# Patient Record
Sex: Female | Born: 1975 | Race: White | Hispanic: No | Smoking: Never smoker
Health system: Southern US, Community
[De-identification: ages and names within clinical notes are randomized; demographics above are authoritative.]

## PROBLEM LIST (undated history)

## (undated) DIAGNOSIS — E15 Nondiabetic hypoglycemic coma: Secondary | ICD-10-CM

## (undated) DIAGNOSIS — I10 Essential (primary) hypertension: Secondary | ICD-10-CM

## (undated) DIAGNOSIS — J45909 Unspecified asthma, uncomplicated: Secondary | ICD-10-CM

## (undated) DIAGNOSIS — D649 Anemia, unspecified: Secondary | ICD-10-CM

## (undated) HISTORY — PX: OTHER SURGICAL HISTORY: SHX169

---

## 2013-01-09 ENCOUNTER — Encounter (HOSPITAL_COMMUNITY): Payer: Self-pay | Admitting: Emergency Medicine

## 2013-01-09 ENCOUNTER — Emergency Department (HOSPITAL_COMMUNITY)
Admission: EM | Admit: 2013-01-09 | Discharge: 2013-01-09 | Disposition: A | Discharge status: Home / Self Care | Payer: Medicaid Other | Attending: Emergency Medicine | Admitting: Emergency Medicine

## 2013-01-09 DIAGNOSIS — I10 Essential (primary) hypertension: Secondary | ICD-10-CM | POA: Insufficient documentation

## 2013-01-09 DIAGNOSIS — J45909 Unspecified asthma, uncomplicated: Secondary | ICD-10-CM | POA: Insufficient documentation

## 2013-01-09 DIAGNOSIS — R3915 Urgency of urination: Secondary | ICD-10-CM | POA: Insufficient documentation

## 2013-01-09 DIAGNOSIS — Z87440 Personal history of urinary (tract) infections: Secondary | ICD-10-CM | POA: Insufficient documentation

## 2013-01-09 DIAGNOSIS — R300 Dysuria: Secondary | ICD-10-CM | POA: Insufficient documentation

## 2013-01-09 DIAGNOSIS — R350 Frequency of micturition: Secondary | ICD-10-CM | POA: Insufficient documentation

## 2013-01-09 DIAGNOSIS — H53149 Visual discomfort, unspecified: Secondary | ICD-10-CM | POA: Insufficient documentation

## 2013-01-09 DIAGNOSIS — R112 Nausea with vomiting, unspecified: Secondary | ICD-10-CM | POA: Insufficient documentation

## 2013-01-09 DIAGNOSIS — Z880 Allergy status to penicillin: Secondary | ICD-10-CM | POA: Insufficient documentation

## 2013-01-09 DIAGNOSIS — G43909 Migraine, unspecified, not intractable, without status migrainosus: Secondary | ICD-10-CM | POA: Insufficient documentation

## 2013-01-09 DIAGNOSIS — R109 Unspecified abdominal pain: Secondary | ICD-10-CM | POA: Insufficient documentation

## 2013-01-09 DIAGNOSIS — D649 Anemia, unspecified: Secondary | ICD-10-CM | POA: Insufficient documentation

## 2013-01-09 DIAGNOSIS — Z79899 Other long term (current) drug therapy: Secondary | ICD-10-CM | POA: Insufficient documentation

## 2013-01-09 HISTORY — DX: Nondiabetic hypoglycemic coma: E15

## 2013-01-09 HISTORY — DX: Essential (primary) hypertension: I10

## 2013-01-09 HISTORY — DX: Anemia, unspecified: D64.9

## 2013-01-09 HISTORY — DX: Unspecified asthma, uncomplicated: J45.909

## 2013-01-09 LAB — URINALYSIS, ROUTINE W REFLEX MICROSCOPIC
Leukocytes, UA: NEGATIVE
Nitrite: NEGATIVE
Specific Gravity, Urine: 1.023 (ref 1.005–1.030)
Urobilinogen, UA: 1 mg/dL (ref 0.0–1.0)
pH: 5.5 (ref 5.0–8.0)

## 2013-01-09 LAB — WET PREP, GENITAL
Clue Cells Wet Prep HPF POC: NONE SEEN
Trich, Wet Prep: NONE SEEN

## 2013-01-09 MED ORDER — PROMETHAZINE HCL 25 MG PO TABS: 25.0000 mg | ORAL_TABLET | Freq: Four times a day (QID) | ORAL | Status: AC | PRN

## 2013-01-09 MED ORDER — DIPHENHYDRAMINE HCL 25 MG PO CAPS
50.0000 mg | ORAL_CAPSULE | Freq: Once | ORAL | Status: DC
  Filled 2013-01-09 (×2): qty 1

## 2013-01-09 MED ORDER — KETOROLAC TROMETHAMINE 30 MG/ML IJ SOLN
30.0000 mg | Freq: Once | INTRAMUSCULAR | Status: AC
  Administered 2013-01-09: 30 mg via INTRAVENOUS
  Filled 2013-01-09: qty 1

## 2013-01-09 MED ORDER — METOCLOPRAMIDE HCL 5 MG/ML IJ SOLN
10.0000 mg | Freq: Once | INTRAMUSCULAR | Status: AC
  Administered 2013-01-09: 10 mg via INTRAVENOUS
  Filled 2013-01-09: qty 2

## 2013-01-09 MED ORDER — ONDANSETRON HCL 4 MG/2ML IJ SOLN
4.0000 mg | Freq: Once | INTRAMUSCULAR | Status: AC
  Administered 2013-01-09: 4 mg via INTRAVENOUS
  Filled 2013-01-09: qty 2

## 2013-01-09 MED ORDER — DIPHENHYDRAMINE HCL 50 MG/ML IJ SOLN
25.0000 mg | Freq: Once | INTRAMUSCULAR | Status: AC
  Administered 2013-01-09: 25 mg via INTRAVENOUS
  Filled 2013-01-09: qty 1

## 2013-01-09 MED ORDER — SODIUM CHLORIDE 0.9 % IV BOLUS (SEPSIS)
1000.0000 mL | Freq: Once | INTRAVENOUS | Status: AC
  Administered 2013-01-09: 1000 mL via INTRAVENOUS

## 2013-01-09 MED ORDER — KETOROLAC TROMETHAMINE 60 MG/2ML IM SOLN
60.0000 mg | Freq: Once | INTRAMUSCULAR | Status: DC
  Filled 2013-01-09: qty 2

## 2013-01-09 MED ORDER — METOCLOPRAMIDE HCL 10 MG PO TABS: 10.0000 mg | ORAL_TABLET | Freq: Once | ORAL | Status: DC

## 2013-01-09 MED ORDER — ONDANSETRON 8 MG PO TBDP
8.0000 mg | ORAL_TABLET | Freq: Once | ORAL | Status: DC
  Filled 2013-01-09: qty 1
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
## 2013-01-09 NOTE — ED Notes (Signed)
Pt states she has bladder pain today causing headache with ora and nausea. States she is having some trouble starting to urinate, frequency x 3 days. Has a history of multiple abdominal surgeries: ureter rerouted and bleeding ulcers, gastric bypass.

## 2013-01-09 NOTE — ED Notes (Signed)
Pt arrived to the ED with a chief complaint of a migraine headache .  Pt states the headache began 2 days ago and it was not until today that the pt felt is was a migraine. Pt also believes she has a UTI due to lower abdominal pain.  No burning upon urination but states she is prone to UTI's and that burning sensation is not a symptom she experiences.

## 2013-01-13 NOTE — ED Provider Notes (Signed)
Medical screening examination/treatment/procedure(s) were conducted as a shared visit with non-physician practitioner(s) and myself.  I personally evaluated the patient during the encounter.  No neuro deficits. Headache improved  Donnetta Hutching, MD 01/13/13 1630

## 2013-05-16 ENCOUNTER — Emergency Department (HOSPITAL_COMMUNITY)
Admission: EM | Admit: 2013-05-16 | Discharge: 2013-05-16 | Disposition: A | Discharge status: Home / Self Care | Payer: Medicaid Other | Source: Home / Self Care | Attending: Family Medicine | Admitting: Family Medicine

## 2013-05-16 ENCOUNTER — Encounter (HOSPITAL_COMMUNITY): Payer: Self-pay | Admitting: Emergency Medicine

## 2013-05-16 DIAGNOSIS — J111 Influenza due to unidentified influenza virus with other respiratory manifestations: Secondary | ICD-10-CM

## 2013-05-16 DIAGNOSIS — A0811 Acute gastroenteropathy due to Norwalk agent: Secondary | ICD-10-CM

## 2013-05-16 MED ORDER — ONDANSETRON 4 MG PO TBDP
ORAL_TABLET | ORAL | Status: AC
  Filled 2013-05-16: qty 2

## 2013-05-16 MED ORDER — ONDANSETRON HCL 4 MG PO TABS: 4.0000 mg | ORAL_TABLET | Freq: Four times a day (QID) | ORAL | Status: AC

## 2013-05-16 MED ORDER — IPRATROPIUM BROMIDE 0.06 % NA SOLN: 2.0000 | Freq: Four times a day (QID) | NASAL | Status: AC

## 2013-05-16 MED ORDER — ONDANSETRON 4 MG PO TBDP
8.0000 mg | ORAL_TABLET | Freq: Once | ORAL | Status: AC
  Administered 2013-05-16: 8 mg via ORAL

## 2013-05-16 NOTE — ED Provider Notes (Signed)
CSN: [id number]     Arrival date & time 05/16/13  1605 History   First MD Initiated Contact with Patient 05/16/13 1738     Chief Complaint  Patient presents with  . Facial Pain  . Influenza   (Consider location/radiation/quality/duration/timing/severity/associated sxs/prior Treatment) Patient is a 37 y.o. female presenting with flu symptoms. The history is provided by the patient and the spouse.  Influenza Presenting symptoms: cough, diarrhea, fever, headache, myalgias, nausea, rhinorrhea, sore throat and vomiting   Severity:  Moderate Duration:  2 weeks Progression:  Waxing and waning Chronicity:  New Associated symptoms: chills, decreased appetite and nasal congestion     Past Medical History  Diagnosis Date  . Asthma   . Anemia   . Hypertension   . Hypoglycemic coma    Past Surgical History  Procedure Laterality Date  . Gallbladder removed     History reviewed. No pertinent family history. History  Substance Use Topics  . Smoking status: Never Smoker   . Smokeless tobacco: Not on file  . Alcohol Use: Yes   OB History   Grav Para Term Preterm Abortions TAB SAB Ect Mult Living                 Review of Systems  Constitutional: Positive for fever, chills and decreased appetite.  HENT: Positive for congestion, rhinorrhea and sore throat.   Respiratory: Positive for cough.   Gastrointestinal: Positive for nausea, vomiting and diarrhea.  Genitourinary: Negative.   Musculoskeletal: Positive for myalgias.  Neurological: Positive for headaches.    Allergies  Erythromycin; Tetracyclines & related; and Penicillins  Home Medications   Current Outpatient Rx  Name  Route  Sig  Dispense  Refill  . clorazepate (TRANXENE) 3.75 MG tablet   Oral   Take 3.75-7.5 mg by mouth daily as needed for anxiety.         Marland Kitchen desvenlafaxine (PRISTIQ) 100 MG 24 hr tablet   Oral   Take 100 mg by mouth daily.         . hydrochlorothiazide (MICROZIDE) 12.5 MG capsule   Oral    Take 12.5 mg by mouth daily.         Marland Kitchen lisinopril (PRINIVIL,ZESTRIL) 10 MG tablet   Oral   Take 10 mg by mouth daily.         Marland Kitchen amphetamine-dextroamphetamine (ADDERALL) 20 MG tablet   Oral   Take 20-30 mg by mouth daily.         Marland Kitchen eletriptan (RELPAX) 40 MG tablet   Oral   Take 40 mg by mouth as needed for migraine. One tablet by mouth at onset of headache. May repeat in 2 hours if headache persists or recurs.         . ferrous sulfate 325 (65 FE) MG tablet   Oral   Take 325 mg by mouth daily with breakfast.         . ibuprofen (ADVIL,MOTRIN) 200 MG tablet   Oral   Take 200 mg by mouth every 6 (six) hours as needed for pain.         Marland Kitchen ipratropium (ATROVENT) 0.06 % nasal spray   Nasal   Place 2 sprays into the nose 4 (four) times daily.   15 mL   1   . Multiple Vitamin (MULTIVITAMIN WITH MINERALS) TABS tablet   Oral   Take 1 tablet by mouth daily.         Marland Kitchen omeprazole (PRILOSEC) 40 MG capsule   Oral  Take 40 mg by mouth daily.         . ondansetron (ZOFRAN) 4 MG tablet   Oral   Take 1 tablet (4 mg total) by mouth every 6 (six) hours. Prn n/v   12 tablet   0   . promethazine (PHENERGAN) 25 MG tablet   Oral   Take 1 tablet (25 mg total) by mouth every 6 (six) hours as needed for nausea.   12 tablet   0   . traMADol (ULTRAM) 50 MG tablet   Oral   Take 50 mg by mouth every 6 (six) hours as needed for pain.          BP 154/76  Pulse 90  Temp(Src) 98.8 F (37.1 C) (Oral)  Resp 18  SpO2 99% Physical Exam  Nursing note and vitals reviewed. Constitutional: She is oriented to person, place, and time. She appears well-developed and well-nourished.  HENT:  Head: Normocephalic.  Right Ear: External ear normal.  Left Ear: External ear normal.  Nose: Nose normal.  Mouth/Throat: Oropharynx is clear and moist.  Eyes: Conjunctivae are normal. Pupils are equal, round, and reactive to light.  Neck: Normal range of motion. Neck supple.   Cardiovascular: Normal rate.   Pulmonary/Chest: Effort normal and breath sounds normal.  Abdominal: Soft. Bowel sounds are normal.  Neurological: She is alert and oriented to person, place, and time.  Skin: Skin is warm and dry.    ED Course  Procedures (including critical care time) Labs Review Labs Reviewed  CULTURE, GROUP A STREP  POCT RAPID STREP A ([facility] CARE ONLY)   Imaging Review No results found.  EKG Interpretation    Date/Time:    Ventricular Rate:    PR Interval:    QRS Duration:   QT Interval:    QTC Calculation:   R Axis:     Text Interpretation:              MDM     Linna Hoff, MD 05/17/13 2024

## 2013-05-16 NOTE — ED Notes (Signed)
C/o sinus pressure and pain.   Body aches.  Fever.  Fatigue. Vomiting and diarrhea.  Chills.   Pt has been using day/night quill with mild relief.  Sinus symptoms present x 2 wks.  States on Thursday started feeling bad all over with the other symptoms.

## 2013-05-18 LAB — CULTURE, GROUP A STREP

## 2013-05-21 NOTE — ED Notes (Signed)
Pt  Called  Continues  To  Have  Symptoms       Pt  Advised  To    Return  As  Needed

## 2013-05-22 ENCOUNTER — Emergency Department (HOSPITAL_COMMUNITY)
Admission: EM | Admit: 2013-05-22 | Discharge: 2013-05-22 | Disposition: A | Discharge status: Home / Self Care | Payer: Medicaid Other | Source: Home / Self Care

## 2013-05-22 NOTE — ED Notes (Signed)
Tearful, weeping patient assessed at request of front desk staff. Pt reports that she has continued to have fever and diarrhea since having been seen by Dr. Melrose Nakayama here at [facility] on 05/16/13.  In the last few days, she reports coughing spells that have prevented her from getting sleep accompanied by periods of high fever and chills. Vitals were obtained and are WNL. Pt was directed to await triage in the waiting area, and to report sudden change in condition to front desk staff.

## 2014-05-26 ENCOUNTER — Emergency Department: Payer: Self-pay | Admitting: Emergency Medicine

## 2014-05-26 ENCOUNTER — Emergency Department (HOSPITAL_COMMUNITY)
Admission: EM | Admit: 2014-05-26 | Discharge: 2014-05-26 | Disposition: A | Discharge status: Home / Self Care | Payer: BC Managed Care – PPO

## 2014-05-26 LAB — COMPREHENSIVE METABOLIC PANEL
ALBUMIN: 3.7 g/dL (ref 3.4–5.0)
ANION GAP: 6 — AB (ref 7–16)
AST: 30 U/L (ref 15–37)
Alkaline Phosphatase: 96 U/L
BUN: 17 mg/dL (ref 7–18)
Bilirubin,Total: 0.5 mg/dL (ref 0.2–1.0)
CALCIUM: 7.8 mg/dL — AB (ref 8.5–10.1)
CREATININE: 0.89 mg/dL (ref 0.60–1.30)
Chloride: 107 mmol/L (ref 98–107)
Co2: 27 mmol/L (ref 21–32)
EGFR (African American): >60
EGFR (Non-African Amer.): >60
Glucose: 86 mg/dL (ref 65–99)
OSMOLALITY: 280 (ref 275–301)
Potassium: 4 mmol/L (ref 3.5–5.1)
SGPT (ALT): 45 U/L
Sodium: 140 mmol/L (ref 136–145)
TOTAL PROTEIN: 6.6 g/dL (ref 6.4–8.2)

## 2014-05-26 LAB — CBC WITH DIFFERENTIAL/PLATELET
BASOS ABS: 0.1 10*3/uL (ref 0.0–0.1)
Basophil %: 1 %
EOS PCT: 2.9 %
Eosinophil #: 0.2 10*3/uL (ref 0.0–0.7)
HCT: 39.1 % (ref 35.0–47.0)
HGB: 12.9 g/dL (ref 12.0–16.0)
LYMPHS PCT: 28.2 %
Lymphocyte #: 1.5 10*3/uL (ref 1.0–3.6)
MCH: 29.6 pg (ref 26.0–34.0)
MCHC: 33 g/dL (ref 32.0–36.0)
MCV: 90 fL (ref 80–100)
MONOS PCT: 9.9 %
Monocyte #: 0.5 x10 3/mm (ref 0.2–0.9)
NEUTROS ABS: 3.1 10*3/uL (ref 1.4–6.5)
Neutrophil %: 58 %
Platelet: 252 10*3/uL (ref 150–440)
RBC: 4.36 10*6/uL (ref 3.80–5.20)
RDW: 13.1 % (ref 11.5–14.5)
WBC: 5.3 10*3/uL (ref 3.6–11.0)

## 2014-05-27 LAB — URINALYSIS, COMPLETE
BLOOD: NEGATIVE
Bilirubin,UR: NEGATIVE
Glucose,UR: NEGATIVE mg/dL (ref 0–75)
Ketone: NEGATIVE
Nitrite: POSITIVE
PH: 5 (ref 4.5–8.0)
Specific Gravity: 1.04 (ref 1.003–1.030)
Squamous Epithelial: <1
WBC UR: 90 /HPF (ref 0–5)

## 2014-05-30 ENCOUNTER — Emergency Department: Payer: Self-pay | Admitting: Emergency Medicine

## 2014-05-30 LAB — URINALYSIS, COMPLETE
BLOOD: NEGATIVE
Bacteria: NONE SEEN
Glucose,UR: NEGATIVE mg/dL (ref 0–75)
Hyaline Cast: 5
LEUKOCYTE ESTERASE: NEGATIVE
NITRITE: NEGATIVE
Ph: 5 (ref 4.5–8.0)
Protein: 30
Specific Gravity: 1.033 (ref 1.003–1.030)
WBC UR: 6 /HPF (ref 0–5)

## 2014-06-01 LAB — URINE CULTURE

## 2014-06-04 ENCOUNTER — Emergency Department: Payer: Self-pay | Admitting: Emergency Medicine

## 2014-06-04 LAB — COMPREHENSIVE METABOLIC PANEL
ALT: 32 U/L
ANION GAP: 7 (ref 7–16)
AST: 29 U/L (ref 15–37)
Albumin: 3.4 g/dL (ref 3.4–5.0)
Alkaline Phosphatase: 88 U/L
BUN: 10 mg/dL (ref 7–18)
Bilirubin,Total: 0.3 mg/dL (ref 0.2–1.0)
CALCIUM: 8.2 mg/dL — AB (ref 8.5–10.1)
CHLORIDE: 110 mmol/L — AB (ref 98–107)
CO2: 24 mmol/L (ref 21–32)
Creatinine: 0.9 mg/dL (ref 0.60–1.30)
EGFR (African American): >60
Glucose: 95 mg/dL (ref 65–99)
Osmolality: 280 (ref 275–301)
POTASSIUM: 4.5 mmol/L (ref 3.5–5.1)
Sodium: 141 mmol/L (ref 136–145)
Total Protein: 6.5 g/dL (ref 6.4–8.2)

## 2014-06-04 LAB — CBC WITH DIFFERENTIAL/PLATELET
BASOS ABS: 0 10*3/uL (ref 0.0–0.1)
Basophil %: 0.9 %
Eosinophil #: 0.1 10*3/uL (ref 0.0–0.7)
Eosinophil %: 2.3 %
HCT: 38 % (ref 35.0–47.0)
HGB: 12.5 g/dL (ref 12.0–16.0)
LYMPHS PCT: 20.4 %
Lymphocyte #: 1.1 10*3/uL (ref 1.0–3.6)
MCH: 29.2 pg (ref 26.0–34.0)
MCHC: 32.9 g/dL (ref 32.0–36.0)
MCV: 89 fL (ref 80–100)
Monocyte #: 0.5 x10 3/mm (ref 0.2–0.9)
Monocyte %: 8.5 %
NEUTROS ABS: 3.7 10*3/uL (ref 1.4–6.5)
Neutrophil %: 67.9 %
Platelet: 271 10*3/uL (ref 150–440)
RBC: 4.27 10*6/uL (ref 3.80–5.20)
RDW: 13.1 % (ref 11.5–14.5)
WBC: 5.5 10*3/uL (ref 3.6–11.0)

## 2014-06-04 LAB — APTT: Activated PTT: 24.3 secs (ref 23.6–35.9)

## 2014-06-05 LAB — CK: CK, Total: 97 U/L (ref 26–192)

## 2014-06-05 LAB — TROPONIN I: Troponin-I: <0.02 ng/mL

## 2014-08-30 ENCOUNTER — Emergency Department (HOSPITAL_COMMUNITY)
Admission: EM | Admit: 2014-08-30 | Discharge: 2014-08-30 | Disposition: A | Discharge status: Home / Self Care | Payer: BLUE CROSS/BLUE SHIELD | Attending: Emergency Medicine | Admitting: Emergency Medicine

## 2014-08-30 ENCOUNTER — Emergency Department (HOSPITAL_COMMUNITY): Payer: BLUE CROSS/BLUE SHIELD

## 2014-08-30 ENCOUNTER — Encounter (HOSPITAL_COMMUNITY): Payer: Self-pay | Admitting: Emergency Medicine

## 2014-08-30 DIAGNOSIS — Y9289 Other specified places as the place of occurrence of the external cause: Secondary | ICD-10-CM | POA: Diagnosis not present

## 2014-08-30 DIAGNOSIS — Z79899 Other long term (current) drug therapy: Secondary | ICD-10-CM | POA: Insufficient documentation

## 2014-08-30 DIAGNOSIS — I10 Essential (primary) hypertension: Secondary | ICD-10-CM | POA: Diagnosis not present

## 2014-08-30 DIAGNOSIS — J45909 Unspecified asthma, uncomplicated: Secondary | ICD-10-CM | POA: Insufficient documentation

## 2014-08-30 DIAGNOSIS — D649 Anemia, unspecified: Secondary | ICD-10-CM | POA: Insufficient documentation

## 2014-08-30 DIAGNOSIS — Z7951 Long term (current) use of inhaled steroids: Secondary | ICD-10-CM | POA: Insufficient documentation

## 2014-08-30 DIAGNOSIS — S199XXA Unspecified injury of neck, initial encounter: Secondary | ICD-10-CM | POA: Diagnosis not present

## 2014-08-30 DIAGNOSIS — Y998 Other external cause status: Secondary | ICD-10-CM | POA: Insufficient documentation

## 2014-08-30 DIAGNOSIS — Z880 Allergy status to penicillin: Secondary | ICD-10-CM | POA: Diagnosis not present

## 2014-08-30 DIAGNOSIS — Y9389 Activity, other specified: Secondary | ICD-10-CM | POA: Insufficient documentation

## 2014-08-30 DIAGNOSIS — F10129 Alcohol abuse with intoxication, unspecified: Secondary | ICD-10-CM | POA: Diagnosis present

## 2014-08-30 DIAGNOSIS — Z8639 Personal history of other endocrine, nutritional and metabolic disease: Secondary | ICD-10-CM | POA: Insufficient documentation
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
## 2014-12-17 ENCOUNTER — Encounter: Payer: Self-pay | Admitting: Emergency Medicine

## 2014-12-17 ENCOUNTER — Emergency Department
Admission: EM | Admit: 2014-12-17 | Discharge: 2014-12-17 | Disposition: A | Discharge status: Home / Self Care | Payer: BLUE CROSS/BLUE SHIELD | Attending: Emergency Medicine | Admitting: Emergency Medicine

## 2014-12-17 DIAGNOSIS — Y9389 Activity, other specified: Secondary | ICD-10-CM | POA: Diagnosis not present

## 2014-12-17 DIAGNOSIS — Z792 Long term (current) use of antibiotics: Secondary | ICD-10-CM | POA: Insufficient documentation

## 2014-12-17 DIAGNOSIS — W57XXXA Bitten or stung by nonvenomous insect and other nonvenomous arthropods, initial encounter: Secondary | ICD-10-CM | POA: Diagnosis not present

## 2014-12-17 DIAGNOSIS — Y9289 Other specified places as the place of occurrence of the external cause: Secondary | ICD-10-CM | POA: Insufficient documentation

## 2014-12-17 DIAGNOSIS — S60469A Insect bite (nonvenomous) of unspecified finger, initial encounter: Secondary | ICD-10-CM

## 2014-12-17 DIAGNOSIS — L089 Local infection of the skin and subcutaneous tissue, unspecified: Secondary | ICD-10-CM

## 2014-12-17 DIAGNOSIS — Z79899 Other long term (current) drug therapy: Secondary | ICD-10-CM | POA: Insufficient documentation

## 2014-12-17 DIAGNOSIS — Z880 Allergy status to penicillin: Secondary | ICD-10-CM | POA: Insufficient documentation

## 2014-12-17 DIAGNOSIS — I10 Essential (primary) hypertension: Secondary | ICD-10-CM | POA: Diagnosis not present

## 2014-12-17 DIAGNOSIS — S60461A Insect bite (nonvenomous) of left index finger, initial encounter: Secondary | ICD-10-CM | POA: Insufficient documentation

## 2014-12-17 DIAGNOSIS — Y998 Other external cause status: Secondary | ICD-10-CM | POA: Diagnosis not present

## 2014-12-17 MED ORDER — SULFAMETHOXAZOLE-TRIMETHOPRIM 800-160 MG PO TABS
1.0000 | ORAL_TABLET | Freq: Once | ORAL | Status: AC
  Administered 2014-12-17: 1 via ORAL
  Filled 2014-12-17: qty 1

## 2014-12-17 MED ORDER — OXYCODONE-ACETAMINOPHEN 7.5-325 MG PO TABS: 1.0000 | ORAL_TABLET | ORAL | Status: AC | PRN

## 2014-12-17 MED ORDER — IBUPROFEN 800 MG PO TABS
800.0000 mg | ORAL_TABLET | Freq: Once | ORAL | Status: AC
  Administered 2014-12-17: 800 mg via ORAL
  Filled 2014-12-17: qty 1

## 2014-12-17 MED ORDER — IBUPROFEN 800 MG PO TABS: 800.0000 mg | ORAL_TABLET | Freq: Three times a day (TID) | ORAL | Status: AC | PRN

## 2014-12-17 MED ORDER — SULFAMETHOXAZOLE-TRIMETHOPRIM 800-160 MG PO TABS: 1.0000 | ORAL_TABLET | Freq: Two times a day (BID) | ORAL | Status: AC

## 2014-12-17 MED ORDER — OXYCODONE-ACETAMINOPHEN 5-325 MG PO TABS
1.0000 | ORAL_TABLET | Freq: Once | ORAL | Status: AC
  Administered 2014-12-17: 1 via ORAL
  Filled 2014-12-17: qty 1

## 2014-12-17 NOTE — Discharge Instructions (Signed)
Wear splint for 2-3 days while awake. Clean finger as directed. May wear bandage aid, but no cream or ointment to area.
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
## 2014-12-17 NOTE — ED Notes (Signed)
Moved some floats on Friday night and thinks a spider bit her. Began weeping on Saturday. Came today due to worsening symptoms.

## 2014-12-17 NOTE — ED Notes (Signed)
Pt here for possible spider bit on left index finger.  Area is red, swollen.

## 2015-05-27 DEATH — deceased
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
